# Patient Record
Sex: Male | Born: 1975 | Race: Black or African American | Hispanic: No | Smoking: Never smoker
Health system: Southern US, Community
[De-identification: ages and names within clinical notes are randomized; demographics above are authoritative.]

---

## 2021-11-05 DIAGNOSIS — Z5321 Procedure and treatment not carried out due to patient leaving prior to being seen by health care provider: Secondary | ICD-10-CM | POA: Insufficient documentation

## 2021-11-05 DIAGNOSIS — R0789 Other chest pain: Secondary | ICD-10-CM | POA: Diagnosis present

## 2021-11-05 DIAGNOSIS — I249 Acute ischemic heart disease, unspecified: Secondary | ICD-10-CM | POA: Diagnosis not present

## 2021-11-05 DIAGNOSIS — R0602 Shortness of breath: Secondary | ICD-10-CM | POA: Insufficient documentation

## 2021-11-06 ENCOUNTER — Emergency Department (HOSPITAL_BASED_OUTPATIENT_CLINIC_OR_DEPARTMENT_OTHER): Payer: No Typology Code available for payment source

## 2021-11-06 ENCOUNTER — Encounter (HOSPITAL_BASED_OUTPATIENT_CLINIC_OR_DEPARTMENT_OTHER): Payer: Self-pay | Admitting: Emergency Medicine

## 2021-11-06 ENCOUNTER — Emergency Department (HOSPITAL_BASED_OUTPATIENT_CLINIC_OR_DEPARTMENT_OTHER)
Admission: EM | Admit: 2021-11-06 | Discharge: 2021-11-06 | Disposition: A | Discharge status: Home / Self Care | Payer: No Typology Code available for payment source | Attending: Emergency Medicine | Admitting: Emergency Medicine

## 2021-11-06 ENCOUNTER — Other Ambulatory Visit: Payer: Self-pay

## 2021-11-06 DIAGNOSIS — R079 Chest pain, unspecified: Secondary | ICD-10-CM

## 2021-11-06 DIAGNOSIS — I249 Acute ischemic heart disease, unspecified: Secondary | ICD-10-CM | POA: Diagnosis present

## 2021-11-06 LAB — CBC WITH DIFFERENTIAL/PLATELET
Abs Immature Granulocytes: 0.01 10*3/uL (ref 0.00–0.07)
Basophils Absolute: 0.1 10*3/uL (ref 0.0–0.1)
Basophils Relative: 1 %
Eosinophils Absolute: 0.1 10*3/uL (ref 0.0–0.5)
Eosinophils Relative: 2 %
HCT: 40.8 % (ref 39.0–52.0)
Hemoglobin: 14 g/dL (ref 13.0–17.0)
Immature Granulocytes: 0 %
Lymphocytes Relative: 39 %
Lymphs Abs: 2.8 10*3/uL (ref 0.7–4.0)
MCH: 29.2 pg (ref 26.0–34.0)
MCHC: 34.3 g/dL (ref 30.0–36.0)
MCV: 85 fL (ref 80.0–100.0)
Monocytes Absolute: 0.7 10*3/uL (ref 0.1–1.0)
Monocytes Relative: 10 %
Neutro Abs: 3.4 10*3/uL (ref 1.7–7.7)
Neutrophils Relative %: 48 %
Platelets: 349 10*3/uL (ref 150–400)
RBC: 4.8 MIL/uL (ref 4.22–5.81)
RDW: 14 % (ref 11.5–15.5)
WBC: 7.1 10*3/uL (ref 4.0–10.5)
nRBC: 0 % (ref 0.0–0.2)

## 2021-11-06 LAB — COMPREHENSIVE METABOLIC PANEL
ALT: 21 U/L (ref 0–44)
AST: 36 U/L (ref 15–41)
Albumin: 4.1 g/dL (ref 3.5–5.0)
Alkaline Phosphatase: 64 U/L (ref 38–126)
Anion gap: 13 (ref 5–15)
BUN: 19 mg/dL (ref 6–20)
CO2: 20 mmol/L — ABNORMAL LOW (ref 22–32)
Calcium: 9 mg/dL (ref 8.9–10.3)
Chloride: 105 mmol/L (ref 98–111)
Creatinine, Ser: 1.46 mg/dL — ABNORMAL HIGH (ref 0.61–1.24)
GFR, Estimated: >60 mL/min (ref >60)
Glucose, Bld: 122 mg/dL — ABNORMAL HIGH (ref 70–99)
Potassium: 2.9 mmol/L — ABNORMAL LOW (ref 3.5–5.1)
Sodium: 138 mmol/L (ref 135–145)
Total Bilirubin: 0.5 mg/dL (ref 0.3–1.2)
Total Protein: 7.8 g/dL (ref 6.5–8.1)

## 2021-11-06 LAB — TROPONIN I (HIGH SENSITIVITY)
Troponin I (High Sensitivity): 3 ng/L (ref <18)
Troponin I (High Sensitivity): 3 ng/L (ref <18)

## 2021-11-06 LAB — D-DIMER, QUANTITATIVE: D-Dimer, Quant: 0.37 ug/mL-FEU (ref 0.00–0.50)

## 2021-11-06 MED ORDER — SODIUM CHLORIDE 0.9 % IV BOLUS
1000.0000 mL | Freq: Once | INTRAVENOUS | Status: AC
  Administered 2021-11-06: 1000 mL via INTRAVENOUS

## 2021-11-06 MED ORDER — ASPIRIN 81 MG PO CHEW
324.0000 mg | CHEWABLE_TABLET | Freq: Once | ORAL | Status: AC
  Administered 2021-11-06: 324 mg via ORAL
  Filled 2021-11-06: qty 4

## 2021-11-06 MED ORDER — HEPARIN (PORCINE) 25000 UT/250ML-% IV SOLN
1150.0000 [IU]/h | INTRAVENOUS | Status: DC
  Filled 2021-11-06: qty 250

## 2021-11-06 MED ORDER — LORAZEPAM 1 MG PO TABS
1.0000 mg | ORAL_TABLET | Freq: Once | ORAL | Status: AC
  Administered 2021-11-06: 1 mg via ORAL
  Filled 2021-11-06: qty 1

## 2021-11-06 MED ORDER — MORPHINE SULFATE (PF) 4 MG/ML IV SOLN
4.0000 mg | Freq: Once | INTRAVENOUS | Status: AC
  Administered 2021-11-06: 4 mg via INTRAVENOUS
  Filled 2021-11-06: qty 1

## 2021-11-06 MED ORDER — HEPARIN BOLUS VIA INFUSION: 4000.0000 [IU] | Freq: Once | INTRAVENOUS | Status: DC

## 2021-11-06 NOTE — ED Notes (Signed)
When pt family went to restroom pt informed me he took a substance and showed me a small seal pack. I was unable to verify but possible male enhancement drug. MD made aware.

## 2021-11-06 NOTE — Progress Notes (Signed)
ANTICOAGULATION CONSULT NOTE - Initial Consult  Pharmacy Consult for heparin Indication: chest pain/ACS  No Known Allergies  Patient Measurements: Height: 5\' 9"  (175.3 cm) Weight: 95.3 kg (210 lb) IBW/kg (Calculated) : 70.7 Heparin Dosing Weight: 90 kg   Vital Signs: BP: 126/94 (01/21 0230) Pulse Rate: 74 (01/21 0245)  Labs: Recent Labs    11/06/21 0014 11/06/21 0020 11/06/21 0214  HGB  --  14.0  --   HCT  --  40.8  --   PLT  --  349  --   CREATININE  --  1.46*  --   TROPONINIHS 3  --  3    Estimated Creatinine Clearance: 72.8 mL/min (A) (by C-G formula based on SCr of 1.46 mg/dL (H)).   Medical History: History reviewed. No pertinent past medical history.  Medications:  (Not in a hospital admission)   Assessment: 45 YOM with chest pain to start IV heparin for ACS.  H/H and Plt wnl. SCr elevated.   Goal of Therapy:  Heparin level 0.3-0.7 units/ml Monitor platelets by anticoagulation protocol: Yes   Plan:  -Heparin 4000 units IV bolus, then start heparin infusion at 1150 units/hr  -F/u 6 hr HL -Monitor daily HL, CBC and s/s of bleeding   11/08/21, PharmD., BCPS, BCCCP Clinical Pharmacist Please refer to [facility] for unit-specific pharmacist

## 2021-11-06 NOTE — ED Triage Notes (Signed)
Chest pain, SOB, X 30 min. Pt also has dry mouth.

## 2021-11-06 NOTE — ED Notes (Signed)
Pt does not want to be admitted and wants to leave AMA. Dr. Manus Gunning talked to pt and family member x 2. Pt verbalized understanding of risks of leaving.

## 2021-11-06 NOTE — ED Notes (Signed)
Portable XR at bedside

## 2021-11-06 NOTE — ED Provider Notes (Signed)
[facility] EMERGENCY DEPARTMENT Provider Note   CSN: [id number] Arrival date & time: 11/05/21  2358     History  Chief Complaint  Patient presents with   Chest Pain    Jacob Montgomery is a 46 y.o. male.  Patient reports no medical history.  Developed left-sided chest pain about 1 hour ago while smoking marijuana and having intercourse.  States the pain is sharp, constant and achy.  Radiates to his left arm.  Associate with some shortness of breath.  No cough or fever.  No nausea or vomiting or lightheadedness.  No dizziness.  No diaphoresis.  He has had similar pain in the past but never this severe.  Denies any cardiac history. Admits to using tadalafil 3 tablets this evening prior to the onset of chest pain.  He uses this occasionally. Never had a stress test.  No stents in his heart.  The history is provided by the patient.  Chest Pain Associated symptoms: shortness of breath   Associated symptoms: no abdominal pain, no fever, no headache, no nausea and no vomiting       Home Medications Prior to Admission medications   Not on File      Allergies    Patient has no known allergies.    Review of Systems   Review of Systems  Constitutional:  Negative for activity change, appetite change and fever.  HENT:  Negative for congestion and nosebleeds.   Respiratory:  Positive for chest tightness and shortness of breath.   Cardiovascular:  Positive for chest pain.  Gastrointestinal:  Negative for abdominal pain, nausea and vomiting.  Genitourinary:  Negative for dysuria and hematuria.  Musculoskeletal:  Negative for arthralgias and myalgias.  Skin:  Negative for rash.  Neurological:  Negative for headaches.   all other systems are negative except as noted in the HPI and PMH.   Physical Exam Updated Vital Signs BP 132/69    Pulse (!) 117    Resp 18    Ht 5\' 9"  (1.753 m)    Wt 95.3 kg    SpO2 100%    BMI 31.01 kg/m  Physical Exam Vitals and nursing note reviewed.   Constitutional:      General: He is not in acute distress.    Appearance: He is well-developed.     Comments: Anxious appearing  HENT:     Head: Normocephalic and atraumatic.     Mouth/Throat:     Pharynx: No oropharyngeal exudate.  Eyes:     Conjunctiva/sclera: Conjunctivae normal.     Pupils: Pupils are equal, round, and reactive to light.  Neck:     Comments: No meningismus. Cardiovascular:     Rate and Rhythm: Normal rate and regular rhythm.     Heart sounds: Normal heart sounds. No murmur heard. Pulmonary:     Effort: Pulmonary effort is normal. No respiratory distress.     Breath sounds: Normal breath sounds.  Chest:     Chest wall: No tenderness.  Abdominal:     Palpations: Abdomen is soft.     Tenderness: There is no abdominal tenderness. There is no guarding or rebound.  Musculoskeletal:        General: No tenderness. Normal range of motion.     Cervical back: Normal range of motion and neck supple.  Skin:    General: Skin is warm.  Neurological:     Mental Status: He is alert and oriented to person, place, and time.     Cranial  Nerves: No cranial nerve deficit.     Motor: No abnormal muscle tone.     Coordination: Coordination normal.     Comments:  5/5 strength throughout. CN 2-12 intact.Equal grip strength.   Psychiatric:        Behavior: Behavior normal.    ED Results / Procedures / Treatments   Labs (all labs ordered are listed, but only abnormal results are displayed) Labs Reviewed  COMPREHENSIVE METABOLIC PANEL - Abnormal; Notable for the following components:      Result Value   Potassium 2.9 (*)    CO2 20 (*)    Glucose, Bld 122 (*)    Creatinine, Ser 1.46 (*)    All other components within normal limits  CBC WITH DIFFERENTIAL/PLATELET  D-DIMER, QUANTITATIVE  HEPARIN LEVEL (UNFRACTIONATED)  TROPONIN I (HIGH SENSITIVITY)  TROPONIN I (HIGH SENSITIVITY)    EKG EKG Interpretation  Date/Time:  Saturday November 06 2021 03:27:53  EST Ventricular Rate:  83 PR Interval:  157 QRS Duration: 103 QT Interval:  360 QTC Calculation: 423 R Axis:   11 Text Interpretation: Sinus rhythm No significant change was found Confirmed by Ezequiel Essex [phone]) on 11/06/2021 3:33:22 AM  Radiology DG Chest Port 1 View  Result Date: 11/06/2021 CLINICAL DATA:  Chest pain, shortness of breath EXAM: PORTABLE CHEST 1 VIEW COMPARISON:  None. FINDINGS: The heart size and mediastinal contours are within normal limits. Both lungs are clear. The visualized skeletal structures are unremarkable. IMPRESSION: No active disease. Electronically Signed   By: Rolm Baptise M.D.   On: 11/06/2021 01:02    Procedures Procedures    Medications Ordered in ED Medications  LORazepam (ATIVAN) tablet 1 mg (has no administration in time range)  aspirin chewable tablet 324 mg (has no administration in time range)    ED Course/ Medical Decision Making/ A&P                           Medical Decision Making Amount and/or Complexity of Data Reviewed Labs: ordered. Radiology: ordered. ECG/medicine tests: ordered.  Risk OTC drugs. Prescription drug management. Decision regarding hospitalization.  Left-sided chest pain after using marijuana and cialis this evening.  EKG shows sinus tachycardia.  Nonspecific ST depression in lead II.  Aspirin only given.  Nitroglycerin ss contraindicated.  Troponin negative.  D-dimer negative.  Repeat EKG shows T wave inversions in 3 and aVF which are new.  Patient still having chest pain with concern for ACS. Low suspicion for PE or aortic dissection.  Discussed with cardiology Dr. Alfred Levins.  He agrees patient needs admission.  No heparin at this time given negative troponin.  Chest pain is improving.  Discussed with patient admission as recommended by cardiology.  There is concern for unstable angina or ACS.  Patient initially was agreeable.  Now he states he does not want to stay in the hospital and will leave [place]. Dr. Alfred Levins called back and recommended heparin drip. He is refusing initiation of a heparin drip.  He understands that he is at risk for having a heart attack which can be life-threatening.  He understands that coronary artery disease or other cardiac pathology has not been ruled out.  He appears to have capacity to make this decision and will leave [place]. He understands that he is free to return at any time.   Patient requesting to leave against medical advice. He is alert and oriented x3 and appears to have decision  making capacity. Denies suicidal or homicidal ideation.  Discussed that admission and further testing is recommended and emergent medical conditions have not been ruled out. Discussed that leaving against medical advice may result in clinical deterioration and possible death.         Final Clinical Impression(s) / ED Diagnoses Final diagnoses:  ACS (acute coronary syndrome) [facility])    Rx / DC Orders ED Discharge Orders     None         Kayda Allers, Annie Main, MD 11/06/21 [phone]

## 2023-01-19 IMAGING — DX DG CHEST 1V PORT
1 series · 1 of 1 positions shown · non-contrast
Comparison: None.

CLINICAL DATA: Chest pain, shortness of breath

EXAM:
PORTABLE CHEST 1 VIEW

[chest ap]
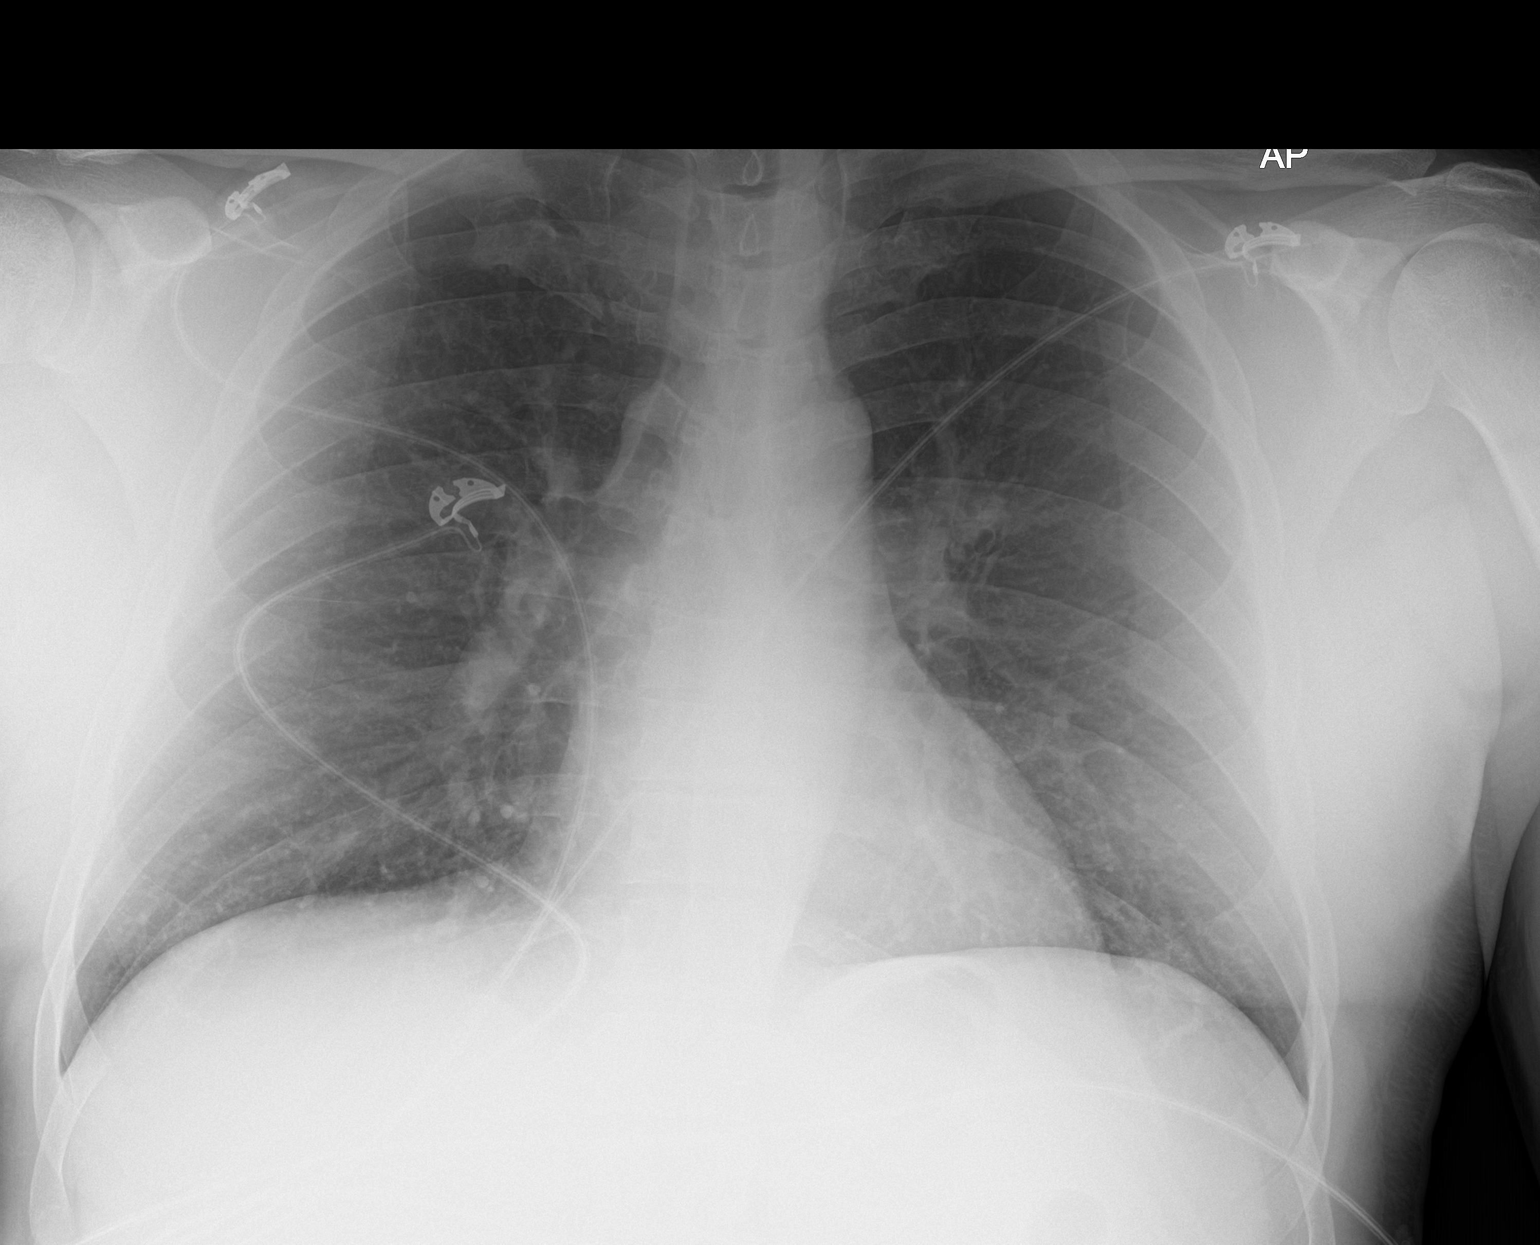

[1 of 1 positions shown; findings below may reference images not displayed]

FINDINGS: The heart size and mediastinal contours are within normal limits.
Both lungs are clear. The visualized skeletal structures are
unremarkable.
IMPRESSION: No active disease.
